# Patient Record
Sex: Female | Born: 1950 | Race: White | Hispanic: No | Marital: Married | Smoking: Never smoker
Health system: Southern US, Community
[De-identification: ages and names within clinical notes are randomized; demographics above are authoritative.]

## PROBLEM LIST (undated history)

## (undated) DIAGNOSIS — M810 Age-related osteoporosis without current pathological fracture: Secondary | ICD-10-CM

## (undated) HISTORY — DX: Age-related osteoporosis without current pathological fracture: M81.0

---

## 2001-11-09 ENCOUNTER — Encounter: Payer: Self-pay | Admitting: Obstetrics and Gynecology

## 2001-11-09 ENCOUNTER — Ambulatory Visit (HOSPITAL_COMMUNITY): Admission: RE | Admit: 2001-11-09 | Discharge: 2001-11-09 | Payer: Self-pay | Admitting: Obstetrics and Gynecology

## 2001-11-23 ENCOUNTER — Ambulatory Visit (HOSPITAL_COMMUNITY): Admission: RE | Admit: 2001-11-23 | Discharge: 2001-11-23 | Payer: Self-pay | Admitting: Obstetrics and Gynecology

## 2001-11-23 ENCOUNTER — Encounter: Payer: Self-pay | Admitting: Obstetrics and Gynecology

## 2003-03-10 ENCOUNTER — Ambulatory Visit (HOSPITAL_COMMUNITY): Admission: RE | Admit: 2003-03-10 | Discharge: 2003-03-10 | Payer: Self-pay | Admitting: Obstetrics and Gynecology

## 2003-03-10 ENCOUNTER — Encounter: Payer: Self-pay | Admitting: Obstetrics and Gynecology

## 2007-02-13 ENCOUNTER — Ambulatory Visit (HOSPITAL_COMMUNITY): Admission: RE | Admit: 2007-02-13 | Discharge: 2007-02-13 | Payer: Self-pay | Admitting: Obstetrics and Gynecology

## 2008-03-05 ENCOUNTER — Ambulatory Visit (HOSPITAL_COMMUNITY): Admission: RE | Admit: 2008-03-05 | Discharge: 2008-03-05 | Payer: Self-pay | Admitting: Obstetrics and Gynecology

## 2008-04-18 ENCOUNTER — Other Ambulatory Visit: Admission: RE | Admit: 2008-04-18 | Discharge: 2008-04-18 | Payer: Self-pay | Admitting: Obstetrics and Gynecology

## 2009-03-16 ENCOUNTER — Ambulatory Visit (HOSPITAL_COMMUNITY): Admission: RE | Admit: 2009-03-16 | Discharge: 2009-03-16 | Payer: Self-pay | Admitting: Obstetrics and Gynecology

## 2009-12-02 ENCOUNTER — Other Ambulatory Visit: Admission: RE | Admit: 2009-12-02 | Discharge: 2009-12-02 | Payer: Self-pay | Admitting: Obstetrics and Gynecology

## 2010-03-23 ENCOUNTER — Ambulatory Visit (HOSPITAL_COMMUNITY): Admission: RE | Admit: 2010-03-23 | Discharge: 2010-03-23 | Payer: Self-pay | Admitting: Obstetrics and Gynecology

## 2011-02-09 ENCOUNTER — Other Ambulatory Visit: Payer: Self-pay | Admitting: Adult Health

## 2011-02-09 ENCOUNTER — Other Ambulatory Visit (HOSPITAL_COMMUNITY)
Admission: RE | Admit: 2011-02-09 | Discharge: 2011-02-09 | Disposition: A | Discharge status: Home / Self Care | Payer: BC Managed Care – PPO | Source: Ambulatory Visit | Attending: Obstetrics and Gynecology | Admitting: Obstetrics and Gynecology

## 2011-02-09 DIAGNOSIS — Z01419 Encounter for gynecological examination (general) (routine) without abnormal findings: Secondary | ICD-10-CM | POA: Insufficient documentation

## 2011-02-25 ENCOUNTER — Other Ambulatory Visit: Payer: Self-pay | Admitting: Adult Health

## 2011-02-25 DIAGNOSIS — Z139 Encounter for screening, unspecified: Secondary | ICD-10-CM

## 2011-03-28 ENCOUNTER — Ambulatory Visit (HOSPITAL_COMMUNITY)
Admission: RE | Admit: 2011-03-28 | Discharge: 2011-03-28 | Disposition: A | Discharge status: Home / Self Care | Payer: BC Managed Care – PPO | Source: Ambulatory Visit | Attending: Adult Health | Admitting: Adult Health

## 2011-03-28 DIAGNOSIS — Z1231 Encounter for screening mammogram for malignant neoplasm of breast: Secondary | ICD-10-CM | POA: Insufficient documentation

## 2011-03-28 DIAGNOSIS — Z139 Encounter for screening, unspecified: Secondary | ICD-10-CM

## 2012-03-06 ENCOUNTER — Other Ambulatory Visit (HOSPITAL_COMMUNITY): Payer: Self-pay | Admitting: Family Medicine

## 2012-03-06 ENCOUNTER — Other Ambulatory Visit: Payer: Self-pay | Admitting: Adult Health

## 2012-03-06 DIAGNOSIS — Z139 Encounter for screening, unspecified: Secondary | ICD-10-CM

## 2012-04-02 ENCOUNTER — Ambulatory Visit (HOSPITAL_COMMUNITY)
Admission: RE | Admit: 2012-04-02 | Discharge: 2012-04-02 | Disposition: A | Discharge status: Home / Self Care | Payer: BC Managed Care – PPO | Source: Ambulatory Visit | Attending: Adult Health | Admitting: Adult Health

## 2012-04-02 DIAGNOSIS — Z139 Encounter for screening, unspecified: Secondary | ICD-10-CM

## 2012-04-02 DIAGNOSIS — Z1231 Encounter for screening mammogram for malignant neoplasm of breast: Secondary | ICD-10-CM | POA: Insufficient documentation

## 2013-04-03 ENCOUNTER — Other Ambulatory Visit: Payer: Self-pay | Admitting: Adult Health

## 2013-04-03 DIAGNOSIS — Z139 Encounter for screening, unspecified: Secondary | ICD-10-CM

## 2013-04-08 ENCOUNTER — Ambulatory Visit (HOSPITAL_COMMUNITY)
Admission: RE | Admit: 2013-04-08 | Discharge: 2013-04-08 | Disposition: A | Discharge status: Home / Self Care | Payer: BC Managed Care – PPO | Source: Ambulatory Visit | Attending: Adult Health | Admitting: Adult Health

## 2013-04-08 DIAGNOSIS — Z1231 Encounter for screening mammogram for malignant neoplasm of breast: Secondary | ICD-10-CM | POA: Insufficient documentation

## 2013-04-08 DIAGNOSIS — Z139 Encounter for screening, unspecified: Secondary | ICD-10-CM

## 2013-04-16 ENCOUNTER — Other Ambulatory Visit: Payer: Self-pay | Admitting: Adult Health

## 2013-04-16 DIAGNOSIS — R928 Other abnormal and inconclusive findings on diagnostic imaging of breast: Secondary | ICD-10-CM

## 2013-05-15 ENCOUNTER — Other Ambulatory Visit: Payer: Self-pay | Admitting: Adult Health

## 2013-05-15 ENCOUNTER — Ambulatory Visit (HOSPITAL_COMMUNITY)
Admission: RE | Admit: 2013-05-15 | Discharge: 2013-05-15 | Disposition: A | Discharge status: Home / Self Care | Payer: BC Managed Care – PPO | Source: Ambulatory Visit | Attending: Adult Health | Admitting: Adult Health

## 2013-05-15 DIAGNOSIS — R928 Other abnormal and inconclusive findings on diagnostic imaging of breast: Secondary | ICD-10-CM

## 2013-05-15 DIAGNOSIS — N6009 Solitary cyst of unspecified breast: Secondary | ICD-10-CM | POA: Insufficient documentation

## 2014-09-09 ENCOUNTER — Other Ambulatory Visit: Payer: Self-pay | Admitting: Adult Health

## 2014-09-09 DIAGNOSIS — Z1231 Encounter for screening mammogram for malignant neoplasm of breast: Secondary | ICD-10-CM

## 2014-09-29 ENCOUNTER — Ambulatory Visit (HOSPITAL_COMMUNITY): Payer: Self-pay

## 2014-10-09 ENCOUNTER — Ambulatory Visit (HOSPITAL_COMMUNITY)
Admission: RE | Admit: 2014-10-09 | Discharge: 2014-10-09 | Disposition: A | Discharge status: Home / Self Care | Payer: No Typology Code available for payment source | Source: Ambulatory Visit | Attending: Adult Health | Admitting: Adult Health

## 2014-10-09 DIAGNOSIS — Z1231 Encounter for screening mammogram for malignant neoplasm of breast: Secondary | ICD-10-CM | POA: Diagnosis not present

## 2014-10-14 ENCOUNTER — Other Ambulatory Visit: Payer: Self-pay | Admitting: Adult Health

## 2014-10-14 DIAGNOSIS — R928 Other abnormal and inconclusive findings on diagnostic imaging of breast: Secondary | ICD-10-CM

## 2014-10-28 ENCOUNTER — Ambulatory Visit (HOSPITAL_COMMUNITY)
Admission: RE | Admit: 2014-10-28 | Discharge: 2014-10-28 | Disposition: A | Discharge status: Home / Self Care | Payer: No Typology Code available for payment source | Source: Ambulatory Visit | Attending: Adult Health | Admitting: Adult Health

## 2014-10-28 ENCOUNTER — Other Ambulatory Visit (HOSPITAL_COMMUNITY): Payer: Self-pay | Admitting: Internal Medicine

## 2014-10-28 ENCOUNTER — Ambulatory Visit (HOSPITAL_COMMUNITY)
Admission: RE | Admit: 2014-10-28 | Discharge: 2014-10-28 | Disposition: A | Discharge status: Home / Self Care | Payer: No Typology Code available for payment source | Source: Ambulatory Visit | Attending: Internal Medicine | Admitting: Internal Medicine

## 2014-10-28 DIAGNOSIS — R928 Other abnormal and inconclusive findings on diagnostic imaging of breast: Secondary | ICD-10-CM | POA: Diagnosis present

## 2014-10-28 DIAGNOSIS — N6320 Unspecified lump in the left breast, unspecified quadrant: Secondary | ICD-10-CM

## 2015-06-01 IMAGING — US US BREAST*L*
1 series · 4 of 4 positions shown · non-contrast
Comparison: Multiple priors

CLINICAL DATA: Abnormal screening mammogram.

EXAM:
DIGITAL DIAGNOSTIC  LEFT MAMMOGRAM
ULTRASOUND LEFT BREAST

[Series 1: us breast*left* · 0.05mm/px · 4 of 4 slices shown]
[im 1/4]
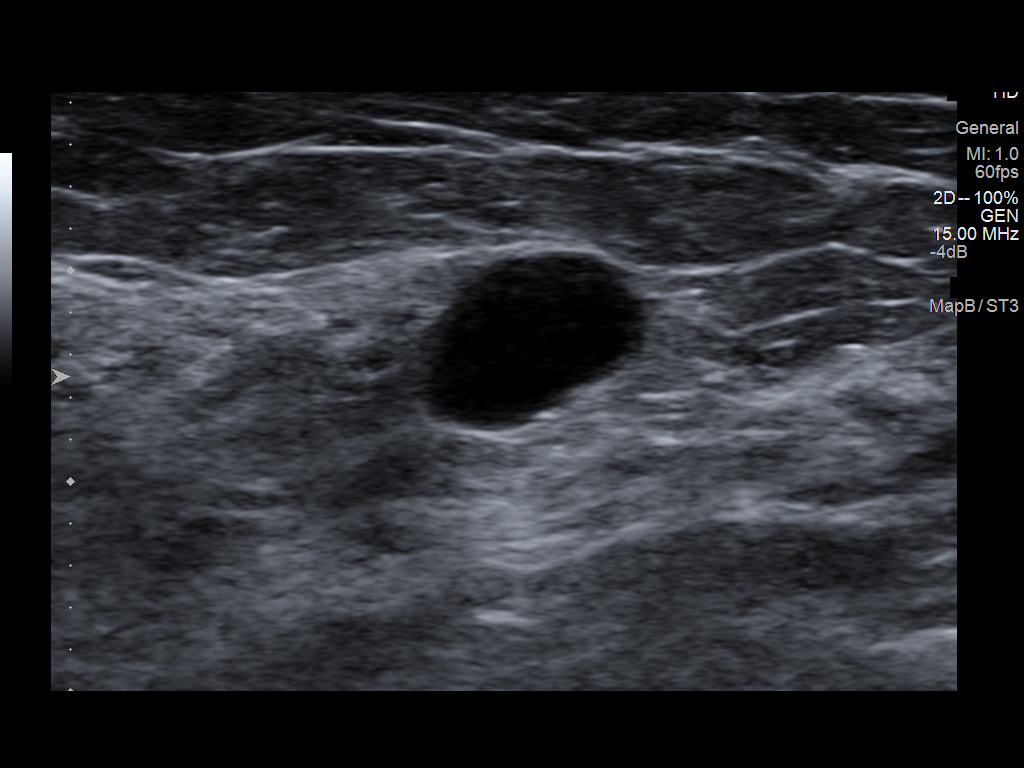
[im 2/4]
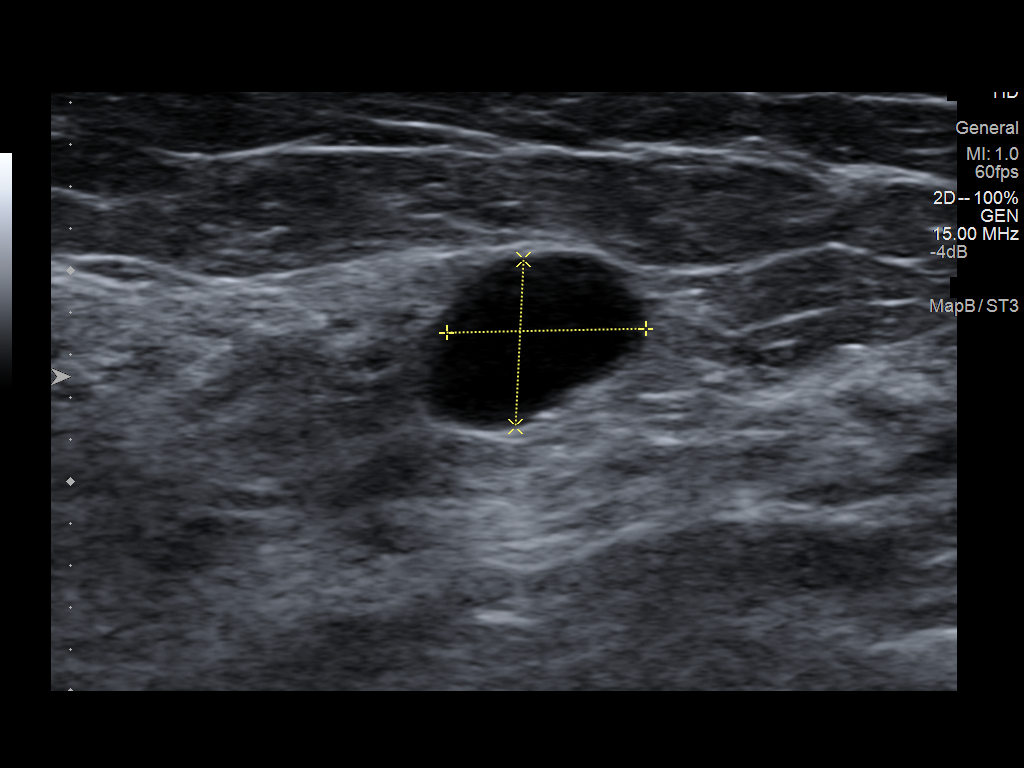
[im 3/4]
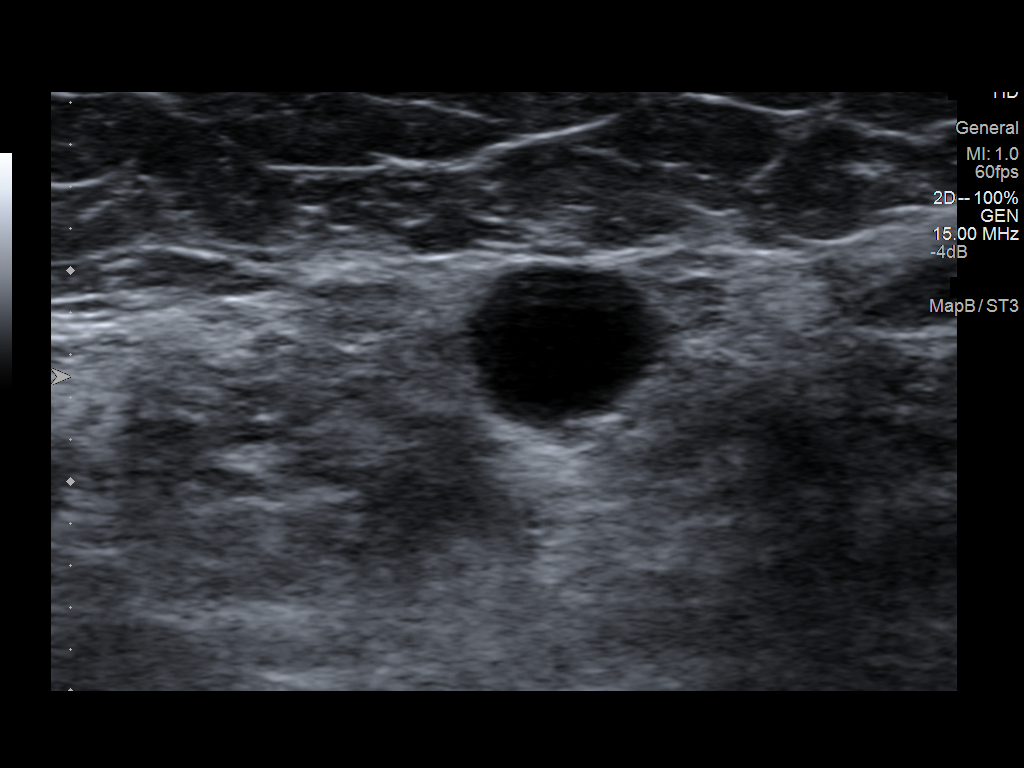
[im 4/4]
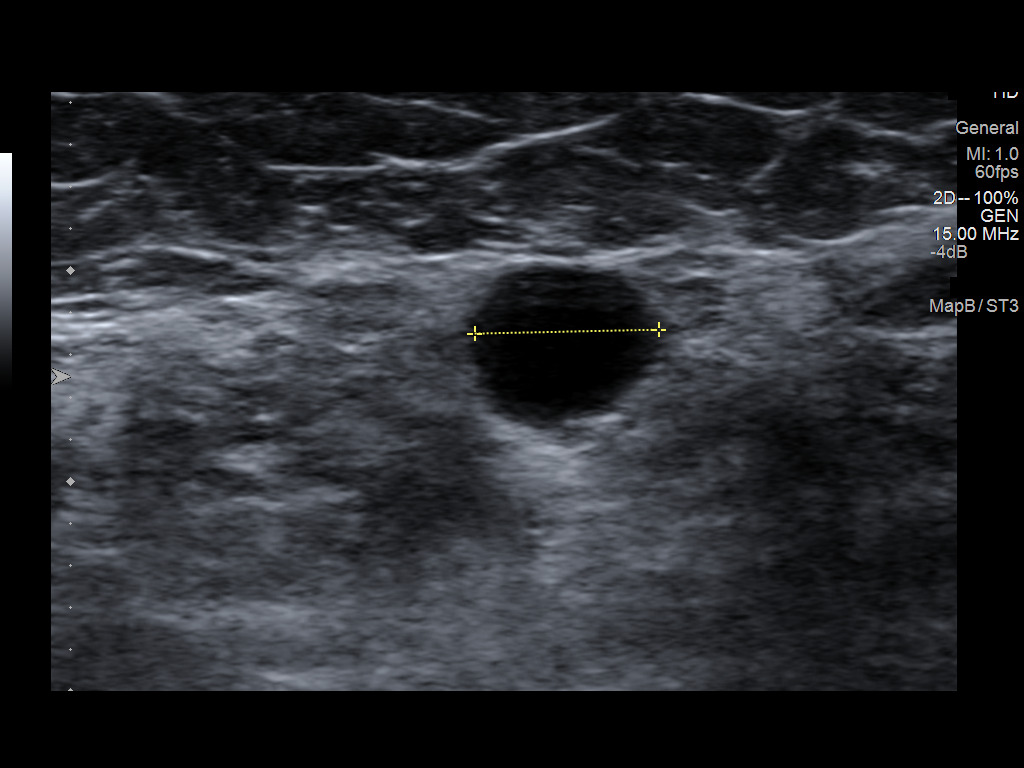

[4 of 4 positions shown; findings below may reference images not displayed]

ACR Breast Density Category c: The breast tissue is heterogeneously
dense, which may obscure small masses.
FINDINGS: A partially obscured isodense oval mass is identified in the central
outer left breast, measuring up to 1 cm.

On physical exam in the area of mammographic concern, no palpable
abnormality is identified.

Targeted ultrasound demonstrates an anechoic cyst at [DATE], 8 cm from
the nipple. This measures 9 x 8 x 9 mm and corresponds with the
mammographic finding.
IMPRESSION: Left breast cyst.

RECOMMENDATION:
Screening mammogram in one year.(Code:TL-[id number])

I have discussed the findings and recommendations with the patient.
Results were also provided in writing at the conclusion of the
visit. If applicable, a reminder letter will be sent to the patient
regarding the next appointment.

BI-RADS CATEGORY  2: Benign Finding(s)

## 2016-11-13 IMAGING — US US BREAST LTD UNI LEFT INC AXILLA
1 series · 4 of 4 positions shown · non-contrast
Comparison: Prior exams

CLINICAL DATA: Call back from screening for a possible left breast
mass.

EXAM:
DIGITAL DIAGNOSTIC LEFT MAMMOGRAM WITH 3D TOMOSYNTHESIS WITH CAD
ULTRASOUND LEFT BREAST

[Series 1: us breast ltd uni left inc axilla · 0.07mm/px · 4 of 4 slices shown]
[im 1/4]
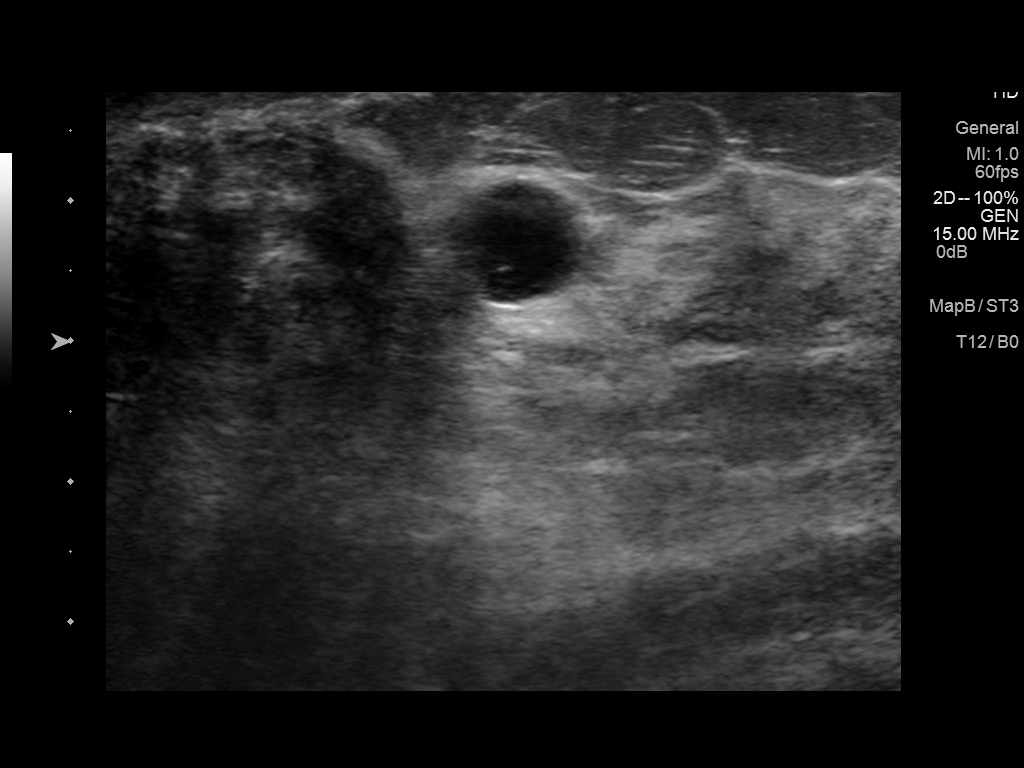
[im 2/4]
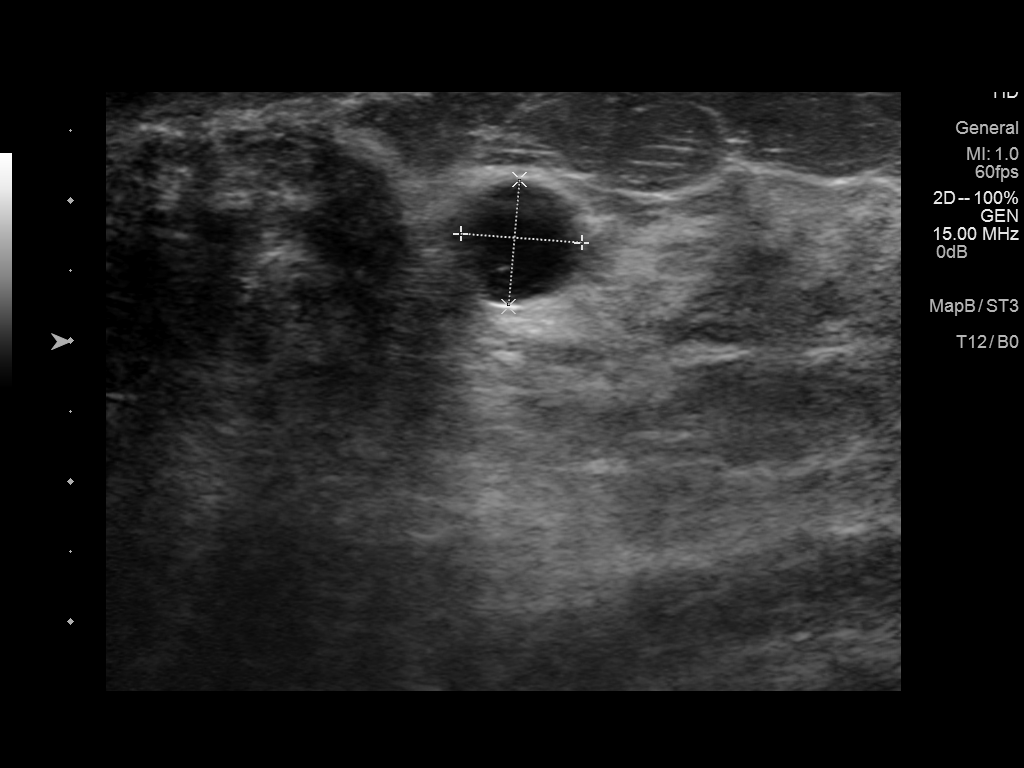
[im 3/4]
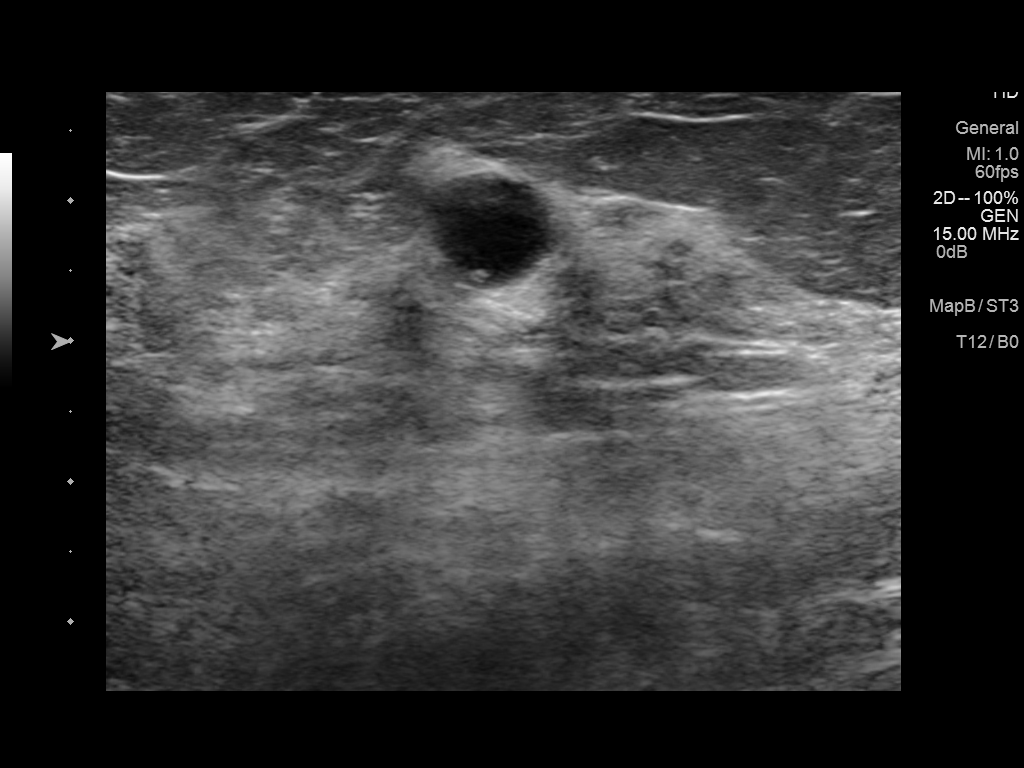
[im 4/4]
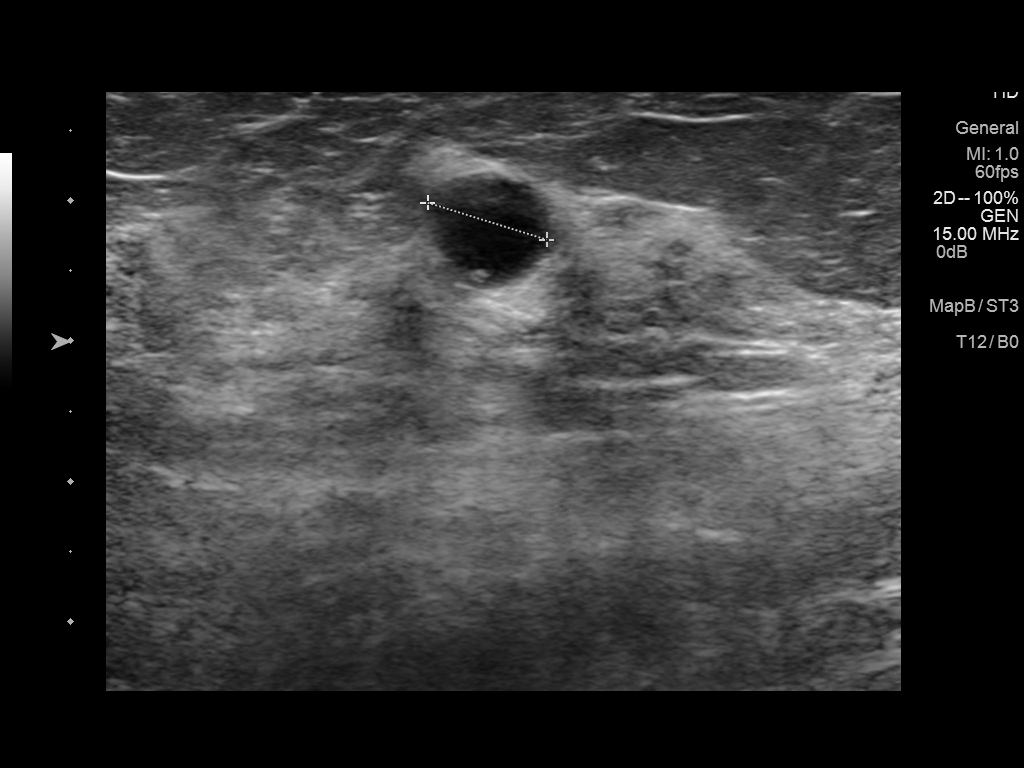

[4 of 4 positions shown; findings below may reference images not displayed]

ACR Breast Density Category c: The breast tissue is heterogeneously
dense, which may obscure small masses.
FINDINGS: The possible mass persists on the 2D and 3D left breast CC and MLO
views. Mass lies inferiorly in the anterior third of the left
breast. It measures approximately 8 mm in size. It is round with
circumscribed margins.

Mammographic images were processed with CAD.

Targeted ultrasound is performed, showing a simple, round cyst in
the [DATE] position of the left breast, 3 cm the nipple, measuring 9
mm. This is consistent in size, shape and location to the
mammographic abnormality
IMPRESSION: Benign left breast cyst.  No evidence of malignancy.

RECOMMENDATION:
Screening mammogram in one year.(Code:[id number])

I have discussed the findings and recommendations with the patient.
Results were also provided in writing at the conclusion of the
visit. If applicable, a reminder letter will be sent to the patient
regarding the next appointment.

BI-RADS CATEGORY  2: Benign.

## 2016-12-12 ENCOUNTER — Other Ambulatory Visit (HOSPITAL_COMMUNITY)
Admission: RE | Admit: 2016-12-12 | Discharge: 2016-12-12 | Disposition: A | Discharge status: Home / Self Care | Payer: 59 | Source: Ambulatory Visit | Attending: Adult Health | Admitting: Adult Health

## 2016-12-12 ENCOUNTER — Ambulatory Visit (INDEPENDENT_AMBULATORY_CARE_PROVIDER_SITE_OTHER): Payer: 59 | Admitting: Adult Health

## 2016-12-12 ENCOUNTER — Encounter: Payer: Self-pay | Admitting: Adult Health

## 2016-12-12 ENCOUNTER — Encounter (INDEPENDENT_AMBULATORY_CARE_PROVIDER_SITE_OTHER): Payer: Self-pay

## 2016-12-12 VITALS — BP 122/70 | HR 64 | Ht 66.0 in | Wt 162.0 lb

## 2016-12-12 DIAGNOSIS — Z1212 Encounter for screening for malignant neoplasm of rectum: Secondary | ICD-10-CM

## 2016-12-12 DIAGNOSIS — Z01419 Encounter for gynecological examination (general) (routine) without abnormal findings: Secondary | ICD-10-CM | POA: Diagnosis not present

## 2016-12-12 DIAGNOSIS — Z1211 Encounter for screening for malignant neoplasm of colon: Secondary | ICD-10-CM | POA: Diagnosis not present

## 2016-12-12 LAB — HEMOCCULT GUIAC POC 1CARD (OFFICE): Fecal Occult Blood, POC: NEGATIVE

## 2016-12-12 NOTE — Progress Notes (Signed)
Patient ID: Teresa Cobb, female   DOB: 01/21/1951, 66 y.o.   MRN: [id number] History of Present Illness: Teresa Cobb is a 66 year old white female, PM in for well woman gyn exam and pap, last pap was in 2012.She works at [employer].  PCP is Dr Lewis MoccasinSkariah.    Current Medications, Allergies, Past Medical History, Past Surgical History, Family History and Social History were reviewed in [facility] electronic medical record.     Review of Systems:  Patient denies any headaches, hearing loss, fatigue, blurred vision, shortness of breath, chest pain, abdominal pain, problems with bowel movements, urination, or intercourse(not having often). No joint pain or mood swings.   Physical Exam:BP 122/70 (BP Location: Right Arm, Patient Position: Sitting, Cuff Size: Normal)   Pulse 64   Ht 5\' 6"  (1.676 m)   Wt 162 lb (73.5 kg)   BMI 26.15 kg/m  General:  Well developed, well nourished, no acute distress Skin:  Warm and dry,tan Neck:  Midline trachea, normal thyroid, good ROM, no lymphadenopathy,no carotid bruits heard Lungs; Clear to auscultation bilaterally Breast:  No dominant palpable mass, retraction, or nipple discharge Cardiovascular: Regular rate and rhythm Abdomen:  Soft, non tender, no hepatosplenomegaly Pelvic:  External genitalia is normal in appearance, no lesions.  The vagina is normal in appearance. Urethra has no lesions or masses. The cervix is smooth, pap with HPV performed.  Uterus is felt to be normal size, shape, and contour.  No adnexal masses or tenderness noted.Bladder is non tender, no masses felt. Rectal: Good sphincter tone, no polyps, or hemorrhoids felt.  Hemoccult negative. Extremities/musculoskeletal:  No swelling or varicosities noted, no clubbing or cyanosis Psych:  No mood changes, alert and cooperative,seems happy PHQ 2 score 0.  Impression: 1. Encounter for gynecological examination with Papanicolaou smear of cervix   2. Screening for colorectal cancer        Plan: Check CBC,CMP,TSH and lipids Physical in 1 year, pap in 3 if normal Mammogram advised Colonoscopy advised

## 2016-12-13 ENCOUNTER — Telehealth: Payer: Self-pay | Admitting: Adult Health

## 2016-12-13 DIAGNOSIS — R7989 Other specified abnormal findings of blood chemistry: Secondary | ICD-10-CM

## 2016-12-13 LAB — COMPREHENSIVE METABOLIC PANEL
ALT: 14 IU/L (ref 0–32)
AST: 18 IU/L (ref 0–40)
Albumin/Globulin Ratio: 1.6 (ref 1.2–2.2)
Albumin: 4.6 g/dL (ref 3.6–4.8)
Alkaline Phosphatase: 55 IU/L (ref 39–117)
BILIRUBIN TOTAL: 0.6 mg/dL (ref 0.0–1.2)
BUN/Creatinine Ratio: 24 (ref 12–28)
BUN: 16 mg/dL (ref 8–27)
CO2: 24 mmol/L (ref 20–29)
Calcium: 9.1 mg/dL (ref 8.7–10.3)
Chloride: 103 mmol/L (ref 96–106)
Creatinine, Ser: 0.67 mg/dL (ref 0.57–1.00)
GFR calc Af Amer: 107 mL/min/{1.73_m2} (ref >59)
GFR, EST NON AFRICAN AMERICAN: 93 mL/min/{1.73_m2} (ref >59)
GLOBULIN, TOTAL: 2.9 g/dL (ref 1.5–4.5)
Glucose: 94 mg/dL (ref 65–99)
POTASSIUM: 4.3 mmol/L (ref 3.5–5.2)
Sodium: 140 mmol/L (ref 134–144)
Total Protein: 7.5 g/dL (ref 6.0–8.5)

## 2016-12-13 LAB — CYTOLOGY - PAP
DIAGNOSIS: NEGATIVE
HPV: NOT DETECTED

## 2016-12-13 LAB — CBC
HEMATOCRIT: 41.6 % (ref 34.0–46.6)
Hemoglobin: 13.8 g/dL (ref 11.1–15.9)
MCH: 30.5 pg (ref 26.6–33.0)
MCHC: 33.2 g/dL (ref 31.5–35.7)
MCV: 92 fL (ref 79–97)
Platelets: 233 10*3/uL (ref 150–379)
RBC: 4.53 x10E6/uL (ref 3.77–5.28)
RDW: 13.9 % (ref 12.3–15.4)
WBC: 6.4 10*3/uL (ref 3.4–10.8)

## 2016-12-13 LAB — LIPID PANEL
CHOLESTEROL TOTAL: 197 mg/dL (ref 100–199)
Chol/HDL Ratio: 2.7 ratio (ref 0.0–4.4)
HDL: 72 mg/dL (ref >39)
LDL CALC: 109 mg/dL — AB (ref 0–99)
Triglycerides: 82 mg/dL (ref 0–149)
VLDL Cholesterol Cal: 16 mg/dL (ref 5–40)

## 2016-12-13 LAB — TSH: TSH: 5.47 u[IU]/mL — ABNORMAL HIGH (ref 0.450–4.500)

## 2016-12-13 NOTE — Telephone Encounter (Signed)
Pt aware of labs and that TSH slightly elevated at 5.470, will recheck in 1 month, all other labs look great.

## 2018-02-07 ENCOUNTER — Ambulatory Visit (INDEPENDENT_AMBULATORY_CARE_PROVIDER_SITE_OTHER): Payer: Medicare Other | Admitting: Adult Health

## 2018-02-07 ENCOUNTER — Encounter: Payer: Self-pay | Admitting: Adult Health

## 2018-02-07 ENCOUNTER — Other Ambulatory Visit: Payer: Self-pay

## 2018-02-07 ENCOUNTER — Other Ambulatory Visit: Payer: Self-pay | Admitting: Adult Health

## 2018-02-07 VITALS — BP 146/77 | HR 68 | Resp 16 | Ht 65.0 in | Wt 160.0 lb

## 2018-02-07 DIAGNOSIS — Z1212 Encounter for screening for malignant neoplasm of rectum: Secondary | ICD-10-CM

## 2018-02-07 DIAGNOSIS — R5383 Other fatigue: Secondary | ICD-10-CM

## 2018-02-07 DIAGNOSIS — R7989 Other specified abnormal findings of blood chemistry: Secondary | ICD-10-CM

## 2018-02-07 DIAGNOSIS — Z1211 Encounter for screening for malignant neoplasm of colon: Secondary | ICD-10-CM | POA: Insufficient documentation

## 2018-02-07 DIAGNOSIS — Z01419 Encounter for gynecological examination (general) (routine) without abnormal findings: Secondary | ICD-10-CM

## 2018-02-07 LAB — HEMOCCULT GUIAC POC 1CARD (OFFICE): FECAL OCCULT BLD: NEGATIVE

## 2018-02-07 NOTE — Progress Notes (Signed)
Patient ID: Teresa Cobb, female   DOB: 04/06/1951, 67 y.o.   MRN: [id number] History of Present Illness:  Rinaldo Cloudamela is a 67 year old white female, married, PM in for a well woman gyn exam,she had a normal pap with negative HPV 12/12/16. PCP is Dr Lewis MoccasinSkariah.  Current Medications, Allergies, Past Medical History, Past Surgical History, Family History and Social History were reviewed in [facility] electronic medical record.     Review of Systems: Patient denies any headaches, hearing loss,  blurred vision, shortness of breath, chest pain, abdominal pain, problems with bowel movements, urination, or intercourse. No joint pain or mood swings. +tired, but walking 45 minutes every day    Physical Exam:BP (!) 146/77 (BP Location: Right Arm, Patient Position: Sitting, Cuff Size: Normal)   Pulse 68   Resp 16   Ht 5\' 5"  (1.651 m)   Wt 160 lb (72.6 kg)   BMI 26.63 kg/m  General:  Well developed, well nourished, no acute distress Skin:  Warm and dry Neck:  Midline trachea, normal thyroid, good ROM, no lymphadenopathy,no carotid bruits heard Lungs; Clear to auscultation bilaterally Breast:  No dominant palpable mass, retraction, or nipple discharge Cardiovascular: Regular rate and rhythm Abdomen:  Soft, non tender, no hepatosplenomegaly, just above C section scar, slight puffiness, non tender Pelvic:  External genitalia is normal in appearance, no lesions.  The vagina is normal in appearance for age, with loss of color, moisture and rugae. Urethra has no lesions or masses. The cervix is smooth.  Uterus is felt to be normal size, shape, and contour.  No adnexal masses or tenderness noted.Bladder is non tender, no masses felt. Rectal: Good sphincter tone, no polyps, or hemorrhoids felt.  Hemoccult negative. Extremities/musculoskeletal:  No swelling or varicosities noted, no clubbing or cyanosis Psych:  No mood changes, alert and cooperative,seems happy PHQ 2 score 0.  Impression: 1. Encounter for  well woman exam with routine gynecological exam   2. Screening for colorectal cancer   3. Elevated TSH   4. Tired       Plan: Check TSH and free T4 Get mammogram  Physical in 1 year Pap 2021 Talk with PCP about aorta scan and EKG as baseline, has had DEXA

## 2018-02-08 ENCOUNTER — Telehealth: Payer: Self-pay | Admitting: Adult Health

## 2018-02-08 LAB — TSH: TSH: 4.12 u[IU]/mL (ref 0.450–4.500)

## 2018-02-08 LAB — T4, FREE: Free T4: 1.12 ng/dL (ref 0.82–1.77)

## 2018-02-08 NOTE — Telephone Encounter (Signed)
Pt aware that labs normal  

## 2022-08-01 ENCOUNTER — Other Ambulatory Visit: Payer: Self-pay

## 2022-08-01 ENCOUNTER — Emergency Department (HOSPITAL_COMMUNITY): Payer: Medicare Other

## 2022-08-01 ENCOUNTER — Encounter (HOSPITAL_COMMUNITY): Payer: Self-pay

## 2022-08-01 ENCOUNTER — Emergency Department (HOSPITAL_COMMUNITY)
Admission: EM | Admit: 2022-08-01 | Discharge: 2022-08-01 | Disposition: A | Discharge status: Home / Self Care | Payer: Medicare Other | Attending: Emergency Medicine | Admitting: Emergency Medicine

## 2022-08-01 DIAGNOSIS — W293XXA Contact with powered garden and outdoor hand tools and machinery, initial encounter: Secondary | ICD-10-CM | POA: Diagnosis not present

## 2022-08-01 DIAGNOSIS — S91112A Laceration without foreign body of left great toe without damage to nail, initial encounter: Secondary | ICD-10-CM | POA: Diagnosis not present

## 2022-08-01 MED ORDER — LIDOCAINE-EPINEPHRINE (PF) 2 %-1:200000 IJ SOLN
20.0000 mL | Freq: Once | INTRAMUSCULAR | Status: AC
  Administered 2022-08-01: 20 mL via INTRADERMAL
  Filled 2022-08-01: qty 20

## 2022-08-01 MED ORDER — NAPROXEN 500 MG PO TABS: 500.0000 mg | ORAL_TABLET | Freq: Two times a day (BID) | ORAL | 0 refills | Status: AC

## 2022-08-01 MED ORDER — BACITRACIN ZINC 500 UNIT/GM EX OINT: TOPICAL_OINTMENT | Freq: Two times a day (BID) | CUTANEOUS | Status: DC

## 2022-08-01 MED ORDER — CEPHALEXIN 500 MG PO CAPS: 500.0000 mg | ORAL_CAPSULE | Freq: Three times a day (TID) | ORAL | 0 refills | Status: AC

## 2022-08-01 NOTE — ED Triage Notes (Signed)
Pt reports left great toe laceration by chainsaw.  Pt reports she went to urgent care and they told her to come to ER because they weren't sure they could stitch it properly.

## 2022-08-01 NOTE — ED Notes (Signed)
Cleaned foot and wrapped with non adherent dressing

## 2022-08-01 NOTE — Discharge Instructions (Signed)
Please take cephalexin 3 times a day to help prevent infections, Naprosyn twice a day for pain, keep a topical antibiotic on the wound at all times with a sterile dressing for up to 1 week.  You should see your doctor within 7 days for recheck of the wound.  Please take a picture of this with your phone so that you can document what it looks like today.  Unfortunately a large piece of tissue over the cut was gone and there is nothing that will help that to heal any faster other than time.  It would likely take the better part of 6 weeks for this to heal.  Please do not wear any closed toed shoes.  Please keep the area clean and dry for 1 week until you follow-up with your doctor.  ER for severe worsening pain swelling fever pus or spreading redness

## 2022-08-01 NOTE — ED Provider Notes (Signed)
[facility] Provider Note   CSN: [id number] Arrival date & time: 08/01/22  1647     History  Chief Complaint  Patient presents with   Extremity Laceration    Teresa Cobb is a 72 y.o. female.  HPI   This patient is a 72 year old female not anticoagulated presents from urgent care after having an accidental injury to her left great toe when a chain saw that she was using to cut limbs off of trees accidentally went through the top of her shoe and cut the top of her toe.  She was seen at the urgent care but referred to the ER.  Dressing placed, no other symptoms, up-to-date on tetanus within the last 4 years  Home Medications Prior to Admission medications   Medication Sig Start Date End Date Taking? Authorizing Provider  cephALEXin (KEFLEX) 500 MG capsule Take 1 capsule (500 mg total) by mouth 3 (three) times daily for 10 days. 08/01/22 08/11/22 Yes Noemi Chapel, MD  naproxen (NAPROSYN) 500 MG tablet Take 1 tablet (500 mg total) by mouth 2 (two) times daily with a meal. 08/01/22  Yes Noemi Chapel, MD  alendronate (FOSAMAX) 70 MG tablet Take 70 mg by mouth once a week. 12/29/17   [provider]  Calcium Carb-Cholecalciferol (CALCIUM 1000 + D PO) Take by mouth 2 (two) times daily.    [provider]  VITAMIN D, CHOLECALCIFEROL, PO Take by mouth.    [provider]      Allergies    Patient has no known allergies.    Review of Systems   Review of Systems  All other systems reviewed and are negative.   Physical Exam Updated Vital Signs BP (!) 155/84 (BP Location: Right Arm)   Pulse 90   Temp 98.2 F (36.8 C) (Oral)   Resp 16   Ht 1.651 m (5\' 5" )   Wt 74.8 kg   SpO2 96%   BMI 27.46 kg/m  Physical Exam Constitutional:      General: She is not in acute distress. HENT:     Head: Normocephalic and atraumatic.  Cardiovascular:     Pulses: Normal pulses.  Musculoskeletal:     Comments: Able to  dorsiflex and plantarflex the left great toe, open wound on the dorsum of the toe, no exposed bone  Skin:    Comments: Approximately 1-1/2 cm laceration over the dorsal surface of the left great toe, approximately 1/4 inch in diameter, ragged edges, no bleeding  Neurological:     Mental Status: She is alert.     ED Results / Procedures / Treatments   Labs (all labs ordered are listed, but only abnormal results are displayed) Labs Reviewed - No data to display  EKG None  Radiology DG Toe Great Left  Result Date: 08/01/2022 CLINICAL DATA:  Pain after trauma.  Laceration by chain saw EXAM: LEFT GREAT TOE COMPARISON:  None Available. FINDINGS: No fracture or dislocation. Preserved joint spaces and bone mineralization. No definite radiopaque foreign body. Dorsal distal toe soft tissue injury. IMPRESSION: No acute osseous abnormality.  No definite radiopaque foreign body Electronically Signed   By: Jill Side M.D.   On: 08/01/2022 18:07    Procedures .Marland KitchenLaceration Repair  Date/Time: 08/01/2022 8:37 PM  Performed by: Noemi Chapel, MD Authorized by: Noemi Chapel, MD   Consent:    Consent obtained:  Verbal   Consent given by:  Patient   Risks, benefits, and alternatives were discussed: yes  Risks discussed:  Infection, need for additional repair, nerve damage, poor wound healing, poor cosmetic result, pain, retained foreign body, tendon damage and vascular damage   Alternatives discussed:  No treatment and delayed treatment Universal protocol:    Procedure explained and questions answered to patient or proxy's satisfaction: yes     Relevant documents present and verified: yes     Test results available: yes     Imaging studies available: yes     Required blood products, implants, devices, and special equipment available: yes     Site/side marked: yes     Immediately prior to procedure, a time out was called: yes     Patient identity confirmed:  Verbally with patient Anesthesia:     Anesthesia method:  Local infiltration   Local anesthetic:  Lidocaine 1% WITH epi Laceration details:    Location:  Toe   Toe location:  L big toe   Length (cm):  2   Depth (mm):  5 Pre-procedure details:    Preparation:  Patient was prepped and draped in usual sterile fashion and imaging obtained to evaluate for foreign bodies Exploration:    Limited defect created (wound extended): no     Imaging obtained: x-ray     Imaging outcome: foreign body not noted     Wound exploration: wound explored through full range of motion and entire depth of wound visualized     Wound extent: fascia violated     Wound extent: no foreign body, no signs of injury, no nerve damage, no tendon damage, no underlying fracture and no vascular damage     Contaminated: no   Treatment:    Area cleansed with:  Saline   Amount of cleaning:  Extensive   Irrigation solution:  Sterile saline   Visualized foreign bodies/material removed: no     Debridement:  Moderate (Scissors and forceps used to remove devitalized tissue from both edges of the wound) Skin repair:    Repair method:  Sutures   Suture size:  4-0   Suture material:  Nylon   Suture technique:  Horizontal mattress   Number of sutures:  2 Approximation:    Approximation:  Loose Repair type:    Repair type:  Intermediate Post-procedure details:    Dressing:  Antibiotic ointment and sterile dressing   Procedure completion:  Tolerated well, no immediate complications Comments:           Medications Ordered in ED Medications  lidocaine-EPINEPHrine (XYLOCAINE W/EPI) 2 %-1:200000 (PF) injection 20 mL (20 mLs Intradermal Given 08/01/22 1814)    ED Course/ Medical Decision Making/ A&P                             Medical Decision Making Amount and/or Complexity of Data Reviewed Radiology: ordered.  Risk Prescription drug management.   Open wound Check xray to r/o frx Open wound - irrigate, local anesthesia Seems to have intact  extensor and flexor function of the toe Up-to-date on tetanus        Final Clinical Impression(s) / ED Diagnoses Final diagnoses:  Laceration of left great toe without foreign body present or damage to nail, initial encounter    Rx / DC Orders ED Discharge Orders          Ordered    cephALEXin (KEFLEX) 500 MG capsule  3 times daily        08/01/22 2036    naproxen (NAPROSYN) 500 MG  tablet  2 times daily with meals        08/01/22 2036              Noemi Chapel, MD 08/01/22 2039
